# Patient Record
Sex: Female | Born: 1955 | Race: White | Hispanic: No
Health system: Southern US, Community
[De-identification: ages and names within clinical notes are randomized; demographics above are authoritative.]

## PROBLEM LIST (undated history)

## (undated) DIAGNOSIS — E079 Disorder of thyroid, unspecified: Secondary | ICD-10-CM

## (undated) DIAGNOSIS — F32A Depression, unspecified: Secondary | ICD-10-CM

## (undated) DIAGNOSIS — F329 Major depressive disorder, single episode, unspecified: Secondary | ICD-10-CM

---

## 2014-10-22 ENCOUNTER — Encounter (HOSPITAL_BASED_OUTPATIENT_CLINIC_OR_DEPARTMENT_OTHER): Payer: Self-pay | Admitting: *Deleted

## 2014-10-22 ENCOUNTER — Emergency Department (HOSPITAL_BASED_OUTPATIENT_CLINIC_OR_DEPARTMENT_OTHER)
Admission: EM | Admit: 2014-10-22 | Discharge: 2014-10-22 | Disposition: A | Discharge status: Home / Self Care | Payer: BLUE CROSS/BLUE SHIELD | Attending: Emergency Medicine | Admitting: Emergency Medicine

## 2014-10-22 ENCOUNTER — Emergency Department (HOSPITAL_BASED_OUTPATIENT_CLINIC_OR_DEPARTMENT_OTHER): Payer: BLUE CROSS/BLUE SHIELD

## 2014-10-22 DIAGNOSIS — Y92410 Unspecified street and highway as the place of occurrence of the external cause: Secondary | ICD-10-CM | POA: Diagnosis not present

## 2014-10-22 DIAGNOSIS — S52692A Other fracture of lower end of left ulna, initial encounter for closed fracture: Secondary | ICD-10-CM | POA: Diagnosis not present

## 2014-10-22 DIAGNOSIS — Z79899 Other long term (current) drug therapy: Secondary | ICD-10-CM | POA: Insufficient documentation

## 2014-10-22 DIAGNOSIS — Y998 Other external cause status: Secondary | ICD-10-CM | POA: Insufficient documentation

## 2014-10-22 DIAGNOSIS — S52202A Unspecified fracture of shaft of left ulna, initial encounter for closed fracture: Secondary | ICD-10-CM

## 2014-10-22 DIAGNOSIS — Z8639 Personal history of other endocrine, nutritional and metabolic disease: Secondary | ICD-10-CM | POA: Insufficient documentation

## 2014-10-22 DIAGNOSIS — Y9389 Activity, other specified: Secondary | ICD-10-CM | POA: Diagnosis not present

## 2014-10-22 DIAGNOSIS — F329 Major depressive disorder, single episode, unspecified: Secondary | ICD-10-CM | POA: Insufficient documentation

## 2014-10-22 DIAGNOSIS — S59912A Unspecified injury of left forearm, initial encounter: Secondary | ICD-10-CM | POA: Diagnosis present

## 2014-10-22 HISTORY — DX: Disorder of thyroid, unspecified: E07.9

## 2014-10-22 HISTORY — DX: Depression, unspecified: F32.A

## 2014-10-22 HISTORY — DX: Major depressive disorder, single episode, unspecified: F32.9

## 2014-10-22 MED ORDER — OXYCODONE-ACETAMINOPHEN 5-325 MG PO TABS
ORAL_TABLET | ORAL | Status: AC
  Filled 2014-10-22: qty 1

## 2014-10-22 MED ORDER — HYDROCODONE-ACETAMINOPHEN 5-325 MG PO TABS: 2.0000 | ORAL_TABLET | ORAL | Status: AC | PRN

## 2014-10-22 MED ORDER — OXYCODONE-ACETAMINOPHEN 5-325 MG PO TABS
1.0000 | ORAL_TABLET | Freq: Once | ORAL | Status: AC
  Administered 2014-10-22: 1 via ORAL
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
## 2014-10-22 NOTE — Discharge Instructions (Signed)
Forearm Fracture °Your caregiver has diagnosed you as having a broken bone (fracture) of the forearm. This is the part of your arm between the elbow and your wrist. Your forearm is made up of two bones. These are the radius and ulna. A fracture is a break in one or both bones. A cast or splint is used to protect and keep your injured bone from moving. The cast or splint will be on generally for about 5 to 6 weeks, with individual variations. °HOME CARE INSTRUCTIONS  °· Keep the injured part elevated while sitting or lying down. Keeping the injury above the level of your heart (the center of the chest). This will decrease swelling and pain. °· Apply ice to the injury for 15-20 minutes, 03-04 times per day while awake, for 2 days. Put the ice in a plastic bag and place a thin towel between the bag of ice and your cast or splint. °· If you have a plaster or fiberglass cast: °¨ Do not try to scratch the skin under the cast using sharp or pointed objects. °¨ Check the skin around the cast every day. You may put lotion on any red or sore areas. °¨ Keep your cast dry and clean. °· If you have a plaster splint: °¨ Wear the splint as directed. °¨ You may loosen the elastic around the splint if your fingers become numb, tingle, or turn cold or blue. °· Do not put pressure on any part of your cast or splint. It may break. Rest your cast only on a pillow the first 24 hours until it is fully hardened. °· Your cast or splint can be protected during bathing with a plastic bag. Do not lower the cast or splint into water. °· Only take over-the-counter or prescription medicines for pain, discomfort, or fever as directed by your caregiver. °SEEK IMMEDIATE MEDICAL CARE IF:  °· Your cast gets damaged or breaks. °· You have more severe pain or swelling than you did before the cast. °· Your skin or nails below the injury turn blue or gray, or feel cold or numb. °· There is a bad smell or new stains and/or pus like (purulent) drainage  coming from under the cast. °MAKE SURE YOU:  °· Understand these instructions. °· Will watch your condition. °· Will get help right away if you are not doing well or get worse. °Document Released: 09/14/2000 Document Revised: 12/10/2011 Document Reviewed: 05/06/2008 °[employer]® Patient Information ©2015 [employer]. This information is not intended to replace advice given to you by your health care provider. Make sure you discuss any questions you have with your health care provider. ° °
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
